# Patient Record
Sex: Male | Born: 2000 | Race: White | Hispanic: No | Marital: Single | Smoking: Never smoker
Health system: Southern US, Community
[De-identification: ages and names within clinical notes are randomized; demographics above are authoritative.]

## PROBLEM LIST (undated history)

## (undated) DIAGNOSIS — F909 Attention-deficit hyperactivity disorder, unspecified type: Secondary | ICD-10-CM

## (undated) DIAGNOSIS — F902 Attention-deficit hyperactivity disorder, combined type: Secondary | ICD-10-CM

## (undated) HISTORY — PX: OTHER SURGICAL HISTORY: SHX169

## (undated) HISTORY — PX: CIRCUMCISION: SUR203

## (undated) HISTORY — DX: Attention-deficit hyperactivity disorder, combined type: F90.2

---
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
## 2022-02-12 ENCOUNTER — Ambulatory Visit
Admission: EM | Admit: 2022-02-12 | Discharge: 2022-02-12 | Disposition: A | Discharge status: Home / Self Care | Payer: Medicaid Other | Attending: Nurse Practitioner | Admitting: Nurse Practitioner

## 2022-02-12 ENCOUNTER — Encounter: Payer: Self-pay | Admitting: Emergency Medicine

## 2022-02-12 ENCOUNTER — Other Ambulatory Visit: Payer: Self-pay

## 2022-02-12 DIAGNOSIS — Z113 Encounter for screening for infections with a predominantly sexual mode of transmission: Secondary | ICD-10-CM

## 2022-02-12 NOTE — ED Notes (Addendum)
Pt unable to provide urine sample at this time. Pt given water by CMA.  ?

## 2022-02-12 NOTE — ED Triage Notes (Signed)
Pt reports partner told him he should get tested for chlamydia. Pt reports last intercourse a few days ago. Pt denies any symptoms at this time.  ?

## 2022-02-12 NOTE — ED Provider Notes (Signed)
?[facility] ? ? ? ?CSN: [id number] ?Arrival date & time: 02/12/22  1521 ? ? ?  ? ?History   ?Chief Complaint ?Chief Complaint  ?Patient presents with  ? Exposure to STD  ? ? ?HPI ?Eric Cuevas is a 21 y.o. male.  ? ?The patient is an 21 year old male who presents for STI testing.  Patient states he was told last evening by his partner that he needs to be tested for chlamydia.  He states his last she will contact with her was approximately 3 days ago.  He denies any penile discharge, dysuria, abdominal pain, testicular/scrotal pain or swelling.  He reports a previous history of chlamydia.  He has been active with 2 male partners in the past 90 days with approximately 50% condom use. ? ?The history is provided by the patient.  ? ?Past Medical History:  ?Diagnosis Date  ? ADHD (attention deficit hyperactivity disorder), combined type   ? Other insomnia 06/13/2019  ? Sinus bradycardia 03/22/2018  ? ? ?Patient Active Problem List  ? Diagnosis Date Noted  ? Pain in joint involving multiple sites 06/06/2020  ? Personal history of noncompliance with medical treatment, presenting hazards to health 12/06/2019  ? ADHD (attention deficit hyperactivity disorder), combined type 06/13/2019  ? Other insomnia 06/13/2019  ? Sinus bradycardia 03/22/2018  ? ? ?Past Surgical History:  ?Procedure Laterality Date  ? CIRCUMCISION    ? TEETH CUT OUT    ? ? ? ? ? ?Home Medications   ? ?Prior to Admission medications   ?Medication Sig Start Date End Date Taking? Authorizing Provider  ?azithromycin (ZITHROMAX) 250 MG tablet Take 2 tabs PO x 1 dose, then 1 tab PO QD x 4 days 06/29/21   Bing Neighbors, FNP  ?levocetirizine (XYZAL) 5 MG tablet Take 1 tablet (5 mg total) by mouth at bedtime. 06/29/21   Bing Neighbors, FNP  ?predniSONE (DELTASONE) 10 MG tablet Take 4 tablets (40 mg total) by mouth daily. 06/19/21   Vanetta Mulders, MD  ?triamcinolone cream (KENALOG) 0.1 % SMARTSIG:2 Topical Twice Daily 06/18/21   [provider]  ?fluticasone (FLONASE) 50 MCG/ACT nasal spray Place 1 spray into both nostrils daily for 14 days. ?Patient not taking: Reported on 06/06/2020 04/08/20 07/23/20  Durward Parcel, FNP  ? ? ?Family History ?Family History  ?Problem Relation Age of Onset  ? Anxiety disorder Mother   ? Healthy Father   ? Healthy Sister   ? Healthy Brother   ? ? ?Social History ?Social History  ? ?Tobacco Use  ? Smoking status: Never  ? Smokeless tobacco: Never  ?Vaping Use  ? Vaping Use: Some days  ?Substance Use Topics  ? Alcohol use: No  ? Drug use: No  ? ? ? ?Allergies   ?Patient has no known allergies. ? ? ?Review of Systems ?Review of Systems  ?Constitutional: Negative.   ?Gastrointestinal: Negative.   ?Genitourinary: Negative.   ?Skin: Negative.   ?Psychiatric/Behavioral: Negative.    ? ? ?Physical Exam ?Triage Vital Signs ?ED Triage Vitals [02/12/22 1537]  ?Enc Vitals Group  ?   BP 132/86  ?   Pulse Rate 78  ?   Resp 18  ?   Temp 97.9 ?F (36.6 ?C)  ?   Temp Source Oral  ?   SpO2 92 %  ?   Weight 158 lb (71.7 kg)  ?   Height 6' (1.829 m)  ?   Head Circumference   ?   Peak  Flow   ?   Pain Score 0  ?   Pain Loc   ?   Pain Edu?   ?   Excl. in GC?   ? ?No data found. ? ?Updated Vital Signs ?BP 132/86 (BP Location: Right Arm)   Pulse 78   Temp 97.9 ?F (36.6 ?C) (Oral)   Resp 18   Ht 6' (1.829 m)   Wt 158 lb (71.7 kg)   SpO2 92%   BMI 21.43 kg/m?  ? ?Visual Acuity ?Right Eye Distance:   ?Left Eye Distance:   ?Bilateral Distance:   ? ?Right Eye Near:   ?Left Eye Near:    ?Bilateral Near:    ? ?Physical Exam ?Vitals and nursing note reviewed.  ?Constitutional:   ?   Appearance: Normal appearance.  ?Pulmonary:  ?   Effort: Pulmonary effort is normal.  ?Abdominal:  ?   General: Bowel sounds are normal.  ?   Palpations: Abdomen is soft.  ?Skin: ?   General: Skin is warm and dry.  ?Neurological:  ?   General: No focal deficit present.  ?   Mental Status: He is alert and oriented to person, place, and time.  ?Psychiatric:     ?    Mood and Affect: Mood normal.     ?   Behavior: Behavior normal.  ? ? ? ?UC Treatments / Results  ?Labs ?(all labs ordered are listed, but only abnormal results are displayed) ?Labs Reviewed  ?POCT URINALYSIS DIP (MANUAL ENTRY)  ?CYTOLOGY, (ORAL, ANAL, URETHRAL) ANCILLARY ONLY  ? ? ?EKG ? ? ?Radiology ?No results found. ? ?Procedures ?Procedures (including critical care time) ? ?Medications Ordered in UC ?Medications - No data to display ? ?Initial Impression / Assessment and Plan / UC Course  ?I have reviewed the triage vital signs and the nursing notes. ? ?Pertinent labs & imaging results that were available during my care of the patient were reviewed by me and considered in my medical decision making (see chart for details). ? ?The patient is an 21 year old male who presents for STI testing.  Patient denies any symptoms at this time.  Requested a urinalysis from the patient but he was unable to urinate today.  Cytology swab was collected.  Patient was advised that he will be contacted if his results are positive to provide treatment.  Patient was advised to follow-up if he developed any urinary symptoms in the meantime.  Patient advised to increase condom use.  Follow-up as needed. ?Final Clinical Impressions(s) / UC Diagnoses  ? ?Final diagnoses:  ?None  ? ?Discharge Instructions   ?None ?  ? ?ED Prescriptions   ?None ?  ? ?PDMP not reviewed this encounter. ?  ?Abran Cantor, NP ?02/12/22 1635 ? ?

## 2022-02-12 NOTE — Discharge Instructions (Addendum)
Because you were unable to urinate today, if you develop urinary symptoms such as burning, frequency, urgency, or hesitancy, follow-up. ?Your cytology results should be available within the next 48 to 72 hours.  If your results are positive, you will be contacted and provided treatment. ?Increase your condom use.  Try to use condoms with each sexual encounter to prevent transmission of STI or unwanted pregnancy. ? ?

## 2022-02-13 LAB — CYTOLOGY, (ORAL, ANAL, URETHRAL) ANCILLARY ONLY
Chlamydia: NEGATIVE
Comment: NEGATIVE
Comment: NORMAL
Neisseria Gonorrhea: NEGATIVE

## 2022-05-02 ENCOUNTER — Telehealth: Payer: Medicaid Other | Admitting: Nurse Practitioner

## 2022-05-02 DIAGNOSIS — K089 Disorder of teeth and supporting structures, unspecified: Secondary | ICD-10-CM | POA: Diagnosis not present

## 2022-05-02 DIAGNOSIS — G8929 Other chronic pain: Secondary | ICD-10-CM | POA: Diagnosis not present

## 2022-05-02 MED ORDER — CLINDAMYCIN HCL 300 MG PO CAPS: 300.0000 mg | ORAL_CAPSULE | Freq: Four times a day (QID) | ORAL | 0 refills | Status: AC

## 2022-05-02 NOTE — Progress Notes (Signed)
Virtual Visit Consent   Eric Cuevas, you are scheduled for a virtual visit with Mary-Margaret Daphine Deutscher, FNP, a [facility] provider, today.     Just as with appointments in the office, your consent must be obtained to participate.  Your consent will be active for this visit and any virtual visit you may have with one of our providers in the next 365 days.     If you have a MyChart account, a copy of this consent can be sent to you electronically.  All virtual visits are billed to your insurance company just like a traditional visit in the office.    As this is a virtual visit, video technology does not allow for your provider to perform a traditional examination.  This may limit your provider's ability to fully assess your condition.  If your provider identifies any concerns that need to be evaluated in person or the need to arrange testing (such as labs, EKG, etc.), we will make arrangements to do so.     Although advances in technology are sophisticated, we cannot ensure that it will always work on either your end or our end.  If the connection with a video visit is poor, the visit may have to be switched to a telephone visit.  With either a video or telephone visit, we are not always able to ensure that we have a secure connection.     I need to obtain your verbal consent now.   Are you willing to proceed with your visit today? YES   Eric Cuevas has provided verbal consent on 05/02/2022 for a virtual visit (video or telephone).   Mary-Margaret Daphine Deutscher, FNP   Date: 05/02/2022 10:04 AM   Virtual Visit via Video Note   I, Mary-Margaret Daphine Deutscher, connected with Eric Cuevas ([id number], 2001-04-27) on 05/02/22 at 10:15 AM EDT by a video-enabled telemedicine application and verified that I am speaking with the correct person using two identifiers.  Location: Patient: Virtual Visit Location Patient: Home Provider: Virtual Visit Location Provider: Mobile   I discussed the limitations of  evaluation and management by telemedicine and the availability of in person appointments. The patient expressed understanding and agreed to proceed.    History of Present Illness: Eric Cuevas is a 21 y.o. who identifies as a male who was assigned male at birth, and is being seen today for dental pain.  HPI: Dental Pain  This is a new problem. The current episode started more than 1 month ago. The problem occurs constantly. The problem has been gradually worsening. Associated symptoms include thermal sensitivity (left lower 2nd molar). Pertinent negatives include no fever, oral bleeding or sinus pressure. He has tried nothing for the symptoms.    Review of Systems  Constitutional:  Negative for chills, diaphoresis, fever and weight loss.  HENT:  Negative for sinus pressure.   Eyes:  Negative for blurred vision, double vision and pain.  Respiratory:  Negative for shortness of breath.   Cardiovascular:  Negative for chest pain, palpitations, orthopnea and leg swelling.  Gastrointestinal:  Negative for abdominal pain.  Skin:  Negative for rash.  Neurological:  Negative for dizziness, sensory change, loss of consciousness, weakness and headaches.  Endo/Heme/Allergies:  Negative for polydipsia. Does not bruise/bleed easily.  Psychiatric/Behavioral:  Negative for memory loss. The patient does not have insomnia.   All other systems reviewed and are negative.   Problems:  Patient Active Problem List   Diagnosis Date Noted   Pain in  joint involving multiple sites 06/06/2020   Personal history of noncompliance with medical treatment, presenting hazards to health 12/06/2019   ADHD (attention deficit hyperactivity disorder), combined type 06/13/2019   Other insomnia 06/13/2019   Sinus bradycardia 03/22/2018    Allergies: No Known Allergies Medications:  Current Outpatient Medications:    azithromycin (ZITHROMAX) 250 MG tablet, Take 2 tabs PO x 1 dose, then 1 tab PO QD x 4 days, Disp: 6 tablet,  Rfl: 0   levocetirizine (XYZAL) 5 MG tablet, Take 1 tablet (5 mg total) by mouth at bedtime., Disp: 30 tablet, Rfl: 0   predniSONE (DELTASONE) 10 MG tablet, Take 4 tablets (40 mg total) by mouth daily., Disp: 20 tablet, Rfl: 0   triamcinolone cream (KENALOG) 0.1 %, SMARTSIG:2 Topical Twice Daily, Disp: , Rfl:   Observations/Objective: Patient is well-developed, well-nourished in no acute distress.  Resting comfortably  at home.  Head is normocephalic, atraumatic.  No labored breathing.  Speech is clear and coherent with logical content.  Patient is alert and oriented at baseline.  Unable to see tooth area in video.  Assessment and Plan:  Eric Cuevas in today with chief complaint of Dental Pain   1. Chronic dental pain Assume abscess  Gargle with warm salt water Motrin OTC Q6 hours See dentist as soon as possible  Meds ordered this encounter  Medications   clindamycin (CLEOCIN) 300 MG capsule    Sig: Take 1 capsule (300 mg total) by mouth 4 (four) times daily.    Dispense:  40 capsule    Refill:  0    Order Specific Question:   Supervising Provider    Answer:   Eber Hong [[id number]]        Follow Up Instructions: I discussed the assessment and treatment plan with the patient. The patient was provided an opportunity to ask questions and all were answered. The patient agreed with the plan and demonstrated an understanding of the instructions.  A copy of instructions were sent to the patient via MyChart.  The patient was advised to call back or seek an in-person evaluation if the symptoms worsen or if the condition fails to improve as anticipated.  Time:  I spent 7 minutes with the patient via telehealth technology discussing the above problems/concerns.    Mary-Margaret Daphine Deutscher, FNP

## 2022-05-02 NOTE — Patient Instructions (Signed)
Dental Pain Dental pain is often a sign that something is wrong with your teeth or gums. You can also have pain after a dental treatment. If you have dental pain, it is important to contact your dentist, especially if the cause of the pain is not known. Dental pain may hurt a lot or a little and can be caused by many things, including: Tooth decay (cavities or caries). Infection. The inner part of the tooth being filled with pus (an abscess). Injury. A crack in the tooth. Gums that move back and expose the root of a tooth. Gum disease. Abnormal grinding or clenching of teeth. Not taking good care of your teeth. Sometimes the cause of pain is not known. You may have pain all the time, or it may happen only when you are: Chewing. Exposed to hot or cold temperatures. Eating or drinking foods or drinks that have a lot of sugar in them, such as soda or candy. Follow these instructions at home: Medicines Take over-the-counter and prescription medicines only as told by your dentist. If you were prescribed an antibiotic medicine, take it as told by your dentist. Do not stop taking it even if you start to feel better. Eating and drinking Do not eat foods or drinks that cause you pain. These include: Very hot or very cold foods or drinks. Sweet or sugary foods or drinks. Managing pain and swelling  If told, put ice on the painful area of your face. To do this: Put ice in a plastic bag. Place a towel between your skin and the bag. Leave the ice on for 20 minutes, 2-3 times a day. Take off the ice if your skin turns bright red. This is very important. If you cannot feel pain, heat, or cold, you have a greater risk of damage to the area. Brushing your teeth Brush your teeth twice a day using a fluoride toothpaste. Use a toothpaste made for sensitive teeth as told by your dentist. Use a soft toothbrush. General instructions Floss your teeth at least once a day. Do not put heat on the outside  of your face. Rinse your mouth often with salt water. To make salt water, dissolve -1 tsp (3-6 g) of salt in 1 cup (237 mL) of warm water. Watch your dental pain. Let your dentist know if there are any changes. Keep all follow-up visits. Contact a dentist if: You have dental pain and you do not know why. Medicine does not help your pain. Your symptoms get worse. You have new symptoms. Get help right away if: You cannot open your mouth. You are having trouble breathing or swallowing. You have a fever. Your face, neck, or jaw is swollen. These symptoms may be an emergency. Get help right away. Call your local emergency services (911 in the [place] Do not wait to see if the symptoms will go away. Do not drive yourself to the hospital. Summary Dental pain may be caused by many things, including tooth decay, injury, or infection. In some cases, the cause is not known. Dental pain may hurt a lot or very little. You may have pain all the time, or you may have it only when you eat or drink. Take over-the-counter and prescription medicines only as told by your dentist. Watch your dental pain for any changes. Let your dentist know if symptoms get worse. This information is not intended to replace advice given to you by your health care provider. Make sure you discuss any questions you have with   your health care provider. Document Revised: 07/03/2020 Document Reviewed: 07/03/2020 Elsevier Patient Education  2023 [employer] Inc.  

## 2023-01-07 ENCOUNTER — Emergency Department (HOSPITAL_COMMUNITY)
Admission: EM | Admit: 2023-01-07 | Discharge: 2023-01-07 | Disposition: A | Discharge status: Home / Self Care | Payer: Self-pay | Attending: Student | Admitting: Student

## 2023-01-07 ENCOUNTER — Emergency Department (HOSPITAL_COMMUNITY): Payer: Self-pay

## 2023-01-07 ENCOUNTER — Other Ambulatory Visit: Payer: Self-pay

## 2023-01-07 ENCOUNTER — Encounter (HOSPITAL_COMMUNITY): Payer: Self-pay

## 2023-01-07 DIAGNOSIS — M62830 Muscle spasm of back: Secondary | ICD-10-CM | POA: Insufficient documentation

## 2023-01-07 MED ORDER — LORAZEPAM 1 MG PO TABS
1.0000 mg | ORAL_TABLET | Freq: Once | ORAL | Status: AC
  Administered 2023-01-07: 1 mg via ORAL
  Filled 2023-01-07: qty 1

## 2023-01-07 MED ORDER — NAPROXEN 375 MG PO TABS: 375.0000 mg | ORAL_TABLET | Freq: Two times a day (BID) | ORAL | 0 refills | Status: AC

## 2023-01-07 MED ORDER — CYCLOBENZAPRINE HCL 10 MG PO TABS: 5.0000 mg | ORAL_TABLET | Freq: Two times a day (BID) | ORAL | 0 refills | Status: AC | PRN

## 2023-01-07 MED ORDER — KETOROLAC TROMETHAMINE 60 MG/2ML IM SOLN
60.0000 mg | Freq: Once | INTRAMUSCULAR | Status: AC
  Administered 2023-01-07: 60 mg via INTRAMUSCULAR
  Filled 2023-01-07: qty 2

## 2023-01-07 NOTE — Discharge Instructions (Addendum)
[facility] [address] [place] , [id number] [email or web address] [phone] [email or web address] Book Online [email or web address]  Contact a health care provider if: Your pain is not helped by medicine. Your pain or stiffness is getting worse. You develop pain or stiffness in your neck or lower back. Get help right away if: You have shortness of breath. You have chest pain. You have numbness, tingling, or weakness in your legs. You are not able to control when you pee (urinary incontinence). These symptoms may be an emergency. Get help right away. Call 911.

## 2023-01-07 NOTE — ED Triage Notes (Addendum)
Pt presents with gradual onset back pain that started upon waking. While pt was putting on socks and experienced sudden worsening. Pt denies urinary or fecal incontinence. Pt took 400 mg ibuprofen at 12:30 without relief.

## 2023-01-07 NOTE — ED Provider Notes (Signed)
[facility] Provider Note   CSN: [id number] Arrival date & time: 01/07/23  1400     History  Chief Complaint  Patient presents with   Back Pain    Eric Cuevas is a 22 y.o. male who presents emergency department for sudden onset mid left back pain.  Patient was reaching over to tie his shoes when he had sudden onset of severe pain in the middle part of his thoracic spine.  Patient states it hurts to raise his arms breathe or change position.  He has severe pain when trying to stand upright.  He is never had anything like this before.  Pain is better with rest worse with any movement.  He denies paresthesias or weakness.  He denies any shortness of breath.  The history is provided by the patient.  Back Pain      Home Medications Prior to Admission medications   Medication Sig Start Date End Date Taking? Authorizing Provider  clindamycin (CLEOCIN) 300 MG capsule Take 1 capsule (300 mg total) by mouth 4 (four) times daily. 05/02/22   Hassell Done Mary-Margaret, FNP  levocetirizine (XYZAL) 5 MG tablet Take 1 tablet (5 mg total) by mouth at bedtime. 06/29/21   Scot Jun, NP  predniSONE (DELTASONE) 10 MG tablet Take 4 tablets (40 mg total) by mouth daily. 06/19/21   Fredia Sorrow, MD  triamcinolone cream (KENALOG) 0.1 % SMARTSIG:2 Topical Twice Daily 06/18/21   [provider]  fluticasone (FLONASE) 50 MCG/ACT nasal spray Place 1 spray into both nostrils daily for 14 days. Patient not taking: Reported on 06/06/2020 04/08/20 07/23/20  Emerson Monte, FNP      Allergies    Patient has no known allergies.    Review of Systems   Review of Systems  Musculoskeletal:  Positive for back pain.    Physical Exam Updated Vital Signs BP (!) 156/92 (BP Location: Right Arm)   Pulse 73   Temp 98.2 F (36.8 C) (Oral)   Resp (!) 22   Ht 6' (1.829 m)   Wt 95.3 kg   SpO2 100%   BMI 28.48 kg/m  Physical Exam  ED Results /  Procedures / Treatments   Labs (all labs ordered are listed, but only abnormal results are displayed) Labs Reviewed - No data to display  EKG None  Radiology No results found.  Procedures Procedures    Medications Ordered in ED Medications  LORazepam (ATIVAN) tablet 1 mg (has no administration in time range)  ketorolac (TORADOL) injection 60 mg (has no administration in time range)    ED Course/ Medical Decision Making/ A&P                             Medical Decision Making Patient with back pain.  No neurological deficits and normal neuro exam.  Patient can walk but states is painful.  No loss of bowel or bladder control.  No concern for cauda equina.  No fever, night sweats, weight loss, h/o cancer, IVDU.    Patient reports that this is a recurrent issue for him and he has had it in the past.  I ordered and independently interpreted a lumbar spine and thoracic spine x-ray which showed no acute findings.  I ordered a chest x-ray and independently interpreted this results to rule out spontaneous pneumothorax.  Also no acute findings on plain film  Patient given oral Ativan and anti-inflammatory medication  with significant improvement in his pain..  After review of all data points patient appears to have acute thoracic muscle back spasm.  Will discharge with anti-inflammatories and muscle relaxers.  Advised outpatient follow-up with physical therapy, massage therapy, chiropractor, or dry needling therapy.  Discussed home care including heat and ice as well as massage and advised looking up home care techniques online through physical therapist.  Patient appears otherwise appropriate for discharge at this time.  Discussed return precautions.  Amount and/or Complexity of Data Reviewed Radiology: ordered.  Risk Prescription drug management.          Final Clinical Impression(s) / ED Diagnoses Final diagnoses:  None    Rx / DC Orders ED Discharge Orders      None         Margarita Mail, PA-C 01/08/23 1044    Kommor, [place], MD 01/08/23 1954

## 2023-01-07 NOTE — ED Notes (Signed)
Patient transported to CT 

## 2023-05-24 ENCOUNTER — Emergency Department (HOSPITAL_COMMUNITY): Payer: Medicaid Other

## 2023-05-24 ENCOUNTER — Encounter (HOSPITAL_COMMUNITY): Payer: Self-pay | Admitting: Emergency Medicine

## 2023-05-24 ENCOUNTER — Emergency Department (HOSPITAL_COMMUNITY)
Admission: EM | Admit: 2023-05-24 | Discharge: 2023-05-24 | Disposition: A | Discharge status: Home / Self Care | Payer: Medicaid Other | Attending: Emergency Medicine | Admitting: Emergency Medicine

## 2023-05-24 ENCOUNTER — Other Ambulatory Visit: Payer: Self-pay

## 2023-05-24 DIAGNOSIS — Y92410 Unspecified street and highway as the place of occurrence of the external cause: Secondary | ICD-10-CM | POA: Insufficient documentation

## 2023-05-24 DIAGNOSIS — R0689 Other abnormalities of breathing: Secondary | ICD-10-CM | POA: Diagnosis not present

## 2023-05-24 DIAGNOSIS — Z041 Encounter for examination and observation following transport accident: Secondary | ICD-10-CM | POA: Diagnosis not present

## 2023-05-24 DIAGNOSIS — M546 Pain in thoracic spine: Secondary | ICD-10-CM | POA: Diagnosis not present

## 2023-05-24 MED ORDER — METHOCARBAMOL 500 MG PO TABS: 500.0000 mg | ORAL_TABLET | Freq: Two times a day (BID) | ORAL | 0 refills | Status: AC
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
## 2023-05-24 NOTE — Discharge Instructions (Addendum)
Your x-ray was reassuring today. Please take your medications as prescribed. Take tylenol/ibuprofen for pain. I recommend close follow-up with PCP for reevaluation.  Please do not hesitate to return to emergency department if worrisome signs symptoms we discussed become apparent.

## 2023-08-24 DIAGNOSIS — Z113 Encounter for screening for infections with a predominantly sexual mode of transmission: Secondary | ICD-10-CM | POA: Diagnosis not present

## 2023-12-03 ENCOUNTER — Other Ambulatory Visit: Payer: Self-pay

## 2023-12-03 ENCOUNTER — Telehealth: Payer: Medicaid Other | Admitting: Emergency Medicine

## 2023-12-03 DIAGNOSIS — R051 Acute cough: Secondary | ICD-10-CM

## 2023-12-03 MED ORDER — AZITHROMYCIN 250 MG PO TABS
ORAL_TABLET | ORAL | 0 refills | Status: AC
  Filled 2023-12-03: qty 6, 5d supply, fill #0

## 2023-12-03 MED ORDER — BENZONATATE 100 MG PO CAPS
100.0000 mg | ORAL_CAPSULE | Freq: Two times a day (BID) | ORAL | 0 refills | Status: AC | PRN
  Filled 2023-12-03: qty 20, 10d supply, fill #0

## 2023-12-03 NOTE — Progress Notes (Signed)
Virtual Visit Consent   Eric Cuevas, you are scheduled for a virtual visit with a [facility] provider today. Just as with appointments in the office, your consent must be obtained to participate. Your consent will be active for this visit and any virtual visit you may have with one of our providers in the next 365 days. If you have a MyChart account, a copy of this consent can be sent to you electronically.  As this is a virtual visit, video technology does not allow for your provider to perform a traditional examination. This may limit your provider's ability to fully assess your condition. If your provider identifies any concerns that need to be evaluated in person or the need to arrange testing (such as labs, EKG, etc.), we will make arrangements to do so. Although advances in technology are sophisticated, we cannot ensure that it will always work on either your end or our end. If the connection with a video visit is poor, the visit may have to be switched to a telephone visit. With either a video or telephone visit, we are not always able to ensure that we have a secure connection.  By engaging in this virtual visit, you consent to the provision of healthcare and authorize for your insurance to be billed (if applicable) for the services provided during this visit. Depending on your insurance coverage, you may receive a charge related to this service.  I need to obtain your verbal consent now. Are you willing to proceed with your visit today? Eric Cuevas has provided verbal consent on 12/03/2023 for a virtual visit (video or telephone). Eric Horseman, PA-C  Date: 12/03/2023 2:30 PM   Virtual Visit via Video Note   I, Eric Cuevas, connected with  Eric Cuevas  ([id number], 2000/12/31) on 12/03/23 at  2:30 PM EST by a video-enabled telemedicine application and verified that I am speaking with the correct person using two identifiers.  Location: Patient: Virtual Visit Location Patient:  Home Provider: Virtual Visit Location Provider: Home Office   I discussed the limitations of evaluation and management by telemedicine and the availability of in person appointments. The patient expressed understanding and agreed to proceed.    History of Present Illness: Eric Cuevas is a 23 y.o. who identifies as a male who was assigned male at birth, and is being seen today for cough, fever, and coughing up blood.  States that the phlegm is streaky and with clots of blood.  States that he has some pain in the back.  States that the blood isn't consistent and is only a little bit.  No CP or SOB reported.  HPI: HPI  Problems:  Patient Active Problem List   Diagnosis Date Noted   Pain in joint involving multiple sites 06/06/2020   Personal history of noncompliance with medical treatment, presenting hazards to health 12/06/2019   ADHD (attention deficit hyperactivity disorder), combined type 06/13/2019   Other insomnia 06/13/2019   Sinus bradycardia 03/22/2018    Allergies: No Known Allergies Medications:  Current Outpatient Medications:    clindamycin (CLEOCIN) 300 MG capsule, Take 1 capsule (300 mg total) by mouth 4 (four) times daily., Disp: 40 capsule, Rfl: 0   cyclobenzaprine (FLEXERIL) 10 MG tablet, Take 0.5-1 tablets (5-10 mg total) by mouth 2 (two) times daily as needed for muscle spasms., Disp: 20 tablet, Rfl: 0   levocetirizine (XYZAL) 5 MG tablet, Take 1 tablet (5 mg total) by mouth at bedtime., Disp: 30 tablet, Rfl: 0  methocarbamol (ROBAXIN) 500 MG tablet, Take 1 tablet (500 mg total) by mouth 2 (two) times daily., Disp: 20 tablet, Rfl: 0   naproxen (NAPROSYN) 375 MG tablet, Take 1 tablet (375 mg total) by mouth 2 (two) times daily with a meal., Disp: 20 tablet, Rfl: 0   predniSONE (DELTASONE) 10 MG tablet, Take 4 tablets (40 mg total) by mouth daily., Disp: 20 tablet, Rfl: 0   triamcinolone cream (KENALOG) 0.1 %, SMARTSIG:2 Topical Twice Daily, Disp: , Rfl:    Observations/Objective: Patient is well-developed, well-nourished in no acute distress.  Resting comfortably at home.  Head is normocephalic, atraumatic.  No labored breathing.  Speech is clear and coherent with logical content.  Patient is alert and oriented at baseline.    Assessment and Plan: 1. Acute cough (Primary)  Meds ordered this encounter  Medications   azithromycin (ZITHROMAX) 250 MG tablet    Sig: Take 2 tabs today, then take 1 tab daily until gone.    Dispense:  6 tablet    Refill:  0    Supervising Provider:   Merrilee Jansky [[id number]]   benzonatate (TESSALON) 100 MG capsule    Sig: Take 1 capsule (100 mg total) by mouth 2 (two) times daily as needed for cough.    Dispense:  20 capsule    Refill:  0    Supervising Provider:   Merrilee Jansky [phone]   Discussed that if he is not improving, or if he has worsening symptoms, he should be seen at [facility] or ER for CXR.  Follow Up Instructions: I discussed the assessment and treatment plan with the patient. The patient was provided an opportunity to ask questions and all were answered. The patient agreed with the plan and demonstrated an understanding of the instructions.  A copy of instructions were sent to the patient via MyChart unless otherwise noted below.     The patient was advised to call back or seek an in-person evaluation if the symptoms worsen or if the condition fails to improve as anticipated.    Eric Horseman, PA-C

## 2023-12-03 NOTE — Patient Instructions (Signed)
Eric Cuevas, thank you for joining Roxy Horseman, PA-C for today's virtual visit.  While this provider is not your primary care provider (PCP), if your PCP is located in our provider database this encounter information will be shared with them immediately following your visit.   A [facility] MyChart account gives you access to today's visit and all your visits, tests, and labs performed at [facility] " click here if you don't have a [facility] MyChart account or go to [email or web address]  Consent: (Patient) Eric Cuevas provided verbal consent for this virtual visit at the beginning of the encounter.  Current Medications:  Current Outpatient Medications:    azithromycin (ZITHROMAX) 250 MG tablet, Take 2 tabs today, then take 1 tab daily until gone., Disp: 6 tablet, Rfl: 0   benzonatate (TESSALON) 100 MG capsule, Take 1 capsule (100 mg total) by mouth 2 (two) times daily as needed for cough., Disp: 20 capsule, Rfl: 0   clindamycin (CLEOCIN) 300 MG capsule, Take 1 capsule (300 mg total) by mouth 4 (four) times daily., Disp: 40 capsule, Rfl: 0   cyclobenzaprine (FLEXERIL) 10 MG tablet, Take 0.5-1 tablets (5-10 mg total) by mouth 2 (two) times daily as needed for muscle spasms., Disp: 20 tablet, Rfl: 0   levocetirizine (XYZAL) 5 MG tablet, Take 1 tablet (5 mg total) by mouth at bedtime., Disp: 30 tablet, Rfl: 0   methocarbamol (ROBAXIN) 500 MG tablet, Take 1 tablet (500 mg total) by mouth 2 (two) times daily., Disp: 20 tablet, Rfl: 0   naproxen (NAPROSYN) 375 MG tablet, Take 1 tablet (375 mg total) by mouth 2 (two) times daily with a meal., Disp: 20 tablet, Rfl: 0   predniSONE (DELTASONE) 10 MG tablet, Take 4 tablets (40 mg total) by mouth daily., Disp: 20 tablet, Rfl: 0   triamcinolone cream (KENALOG) 0.1 %, SMARTSIG:2 Topical Twice Daily, Disp: , Rfl:    Medications ordered in this encounter:  Meds ordered this encounter  Medications   azithromycin (ZITHROMAX) 250 MG  tablet    Sig: Take 2 tabs today, then take 1 tab daily until gone.    Dispense:  6 tablet    Refill:  0    Supervising Provider:   Merrilee Jansky [[id number]]   benzonatate (TESSALON) 100 MG capsule    Sig: Take 1 capsule (100 mg total) by mouth 2 (two) times daily as needed for cough.    Dispense:  20 capsule    Refill:  0    Supervising Provider:   Merrilee Jansky [[id number]]     *If you need refills on other medications prior to your next appointment, please contact your pharmacy*  Follow-Up: Call back or seek an in-person evaluation if the symptoms worsen or if the condition fails to improve as anticipated.  [facility] [phone]  Other Instructions    If you have been instructed to have an in-person evaluation today at a local Urgent Care facility, please use the link below. It will take you to a list of all of our available [facility], including address, phone number and hours of operation. Please do not delay care.  [facility]  If you or a family member do not have a primary care provider, use the link below to schedule a visit and establish care. When you choose a [facility] primary care physician or advanced practice provider, you gain a long-term partner in health. Find a Primary Care Provider  Learn more about [facility]'s in-office and virtual care options: [place] [facility] - Get Care Now
